# Patient Record
Sex: Male | Born: 1975 | Race: Black or African American | Hispanic: No | Marital: Single | Smoking: Current every day smoker
Health system: Southern US, Community
[De-identification: ages and names within clinical notes are randomized; demographics above are authoritative.]

## PROBLEM LIST (undated history)

## (undated) DIAGNOSIS — N200 Calculus of kidney: Secondary | ICD-10-CM

---

## 2005-03-11 ENCOUNTER — Emergency Department: Payer: Self-pay | Admitting: Emergency Medicine

## 2005-03-11 ENCOUNTER — Emergency Department: Payer: Self-pay | Admitting: Unknown Physician Specialty

## 2006-06-11 ENCOUNTER — Emergency Department: Payer: Self-pay | Admitting: Emergency Medicine

## 2008-06-03 ENCOUNTER — Emergency Department: Payer: Self-pay | Admitting: Emergency Medicine

## 2008-06-07 ENCOUNTER — Emergency Department: Payer: Self-pay | Admitting: Emergency Medicine

## 2008-08-21 ENCOUNTER — Emergency Department: Payer: Self-pay | Admitting: Emergency Medicine

## 2012-01-03 ENCOUNTER — Emergency Department: Payer: Self-pay | Admitting: Emergency Medicine

## 2015-07-15 DIAGNOSIS — Z5321 Procedure and treatment not carried out due to patient leaving prior to being seen by health care provider: Secondary | ICD-10-CM | POA: Insufficient documentation

## 2015-07-15 DIAGNOSIS — R0981 Nasal congestion: Secondary | ICD-10-CM | POA: Insufficient documentation

## 2015-07-15 NOTE — ED Notes (Signed)
Pt in with co body aches, congestion, and cough since yest. States has had chills at home unsure of fever.

## 2015-07-16 ENCOUNTER — Emergency Department
Admission: EM | Admit: 2015-07-16 | Discharge: 2015-07-16 | Disposition: A | Discharge status: Home / Self Care | Payer: Self-pay | Attending: Emergency Medicine | Admitting: Emergency Medicine

## 2016-12-31 ENCOUNTER — Encounter: Payer: Self-pay | Admitting: Emergency Medicine

## 2016-12-31 ENCOUNTER — Emergency Department: Payer: Self-pay

## 2016-12-31 ENCOUNTER — Emergency Department
Admission: EM | Admit: 2016-12-31 | Discharge: 2016-12-31 | Disposition: A | Discharge status: Home / Self Care | Payer: Self-pay | Attending: Emergency Medicine | Admitting: Emergency Medicine

## 2016-12-31 DIAGNOSIS — R319 Hematuria, unspecified: Secondary | ICD-10-CM | POA: Insufficient documentation

## 2016-12-31 DIAGNOSIS — N201 Calculus of ureter: Secondary | ICD-10-CM | POA: Insufficient documentation

## 2016-12-31 LAB — CBC
HCT: 41.1 % (ref 40.0–52.0)
Hemoglobin: 14 g/dL (ref 13.0–18.0)
MCH: 30.2 pg (ref 26.0–34.0)
MCHC: 34.1 g/dL (ref 32.0–36.0)
MCV: 88.8 fL (ref 80.0–100.0)
PLATELETS: 248 10*3/uL (ref 150–440)
RBC: 4.63 MIL/uL (ref 4.40–5.90)
RDW: 14.7 % — AB (ref 11.5–14.5)
WBC: 12 10*3/uL — ABNORMAL HIGH (ref 3.8–10.6)

## 2016-12-31 LAB — COMPREHENSIVE METABOLIC PANEL
ALK PHOS: 59 U/L (ref 38–126)
ALT: 55 U/L (ref 17–63)
AST: 34 U/L (ref 15–41)
Albumin: 4.4 g/dL (ref 3.5–5.0)
Anion gap: 9 (ref 5–15)
BUN: 14 mg/dL (ref 6–20)
CALCIUM: 9.3 mg/dL (ref 8.9–10.3)
CHLORIDE: 106 mmol/L (ref 101–111)
CO2: 26 mmol/L (ref 22–32)
CREATININE: 1.25 mg/dL — AB (ref 0.61–1.24)
GFR calc non Af Amer: >60 mL/min (ref >60)
GLUCOSE: 113 mg/dL — AB (ref 65–99)
Potassium: 3.9 mmol/L (ref 3.5–5.1)
SODIUM: 141 mmol/L (ref 135–145)
Total Bilirubin: 0.7 mg/dL (ref 0.3–1.2)
Total Protein: 7.7 g/dL (ref 6.5–8.1)

## 2016-12-31 LAB — URINALYSIS, COMPLETE (UACMP) WITH MICROSCOPIC
Bacteria, UA: NONE SEEN
Bilirubin Urine: NEGATIVE
GLUCOSE, UA: NEGATIVE mg/dL
KETONES UR: NEGATIVE mg/dL
Leukocytes, UA: NEGATIVE
Nitrite: NEGATIVE
PH: 5 (ref 5.0–8.0)
Protein, ur: 30 mg/dL — AB
SPECIFIC GRAVITY, URINE: 1.025 (ref 1.005–1.030)

## 2016-12-31 LAB — LIPASE, BLOOD: LIPASE: 50 U/L (ref 11–51)

## 2016-12-31 LAB — PHOSPHORUS: Phosphorus: 5.4 mg/dL — ABNORMAL HIGH (ref 2.5–4.6)

## 2016-12-31 MED ORDER — TAMSULOSIN HCL 0.4 MG PO CAPS: 0.4000 mg | ORAL_CAPSULE | Freq: Every day | ORAL | 0 refills | Status: DC

## 2016-12-31 MED ORDER — OXYCODONE-ACETAMINOPHEN 5-325 MG PO TABS
ORAL_TABLET | ORAL | Status: AC
  Administered 2016-12-31: 1 via ORAL
  Filled 2016-12-31: qty 1

## 2016-12-31 MED ORDER — OXYCODONE HCL 5 MG PO TABS: 5.0000 mg | ORAL_TABLET | Freq: Four times a day (QID) | ORAL | 0 refills | Status: DC | PRN

## 2016-12-31 MED ORDER — SODIUM CHLORIDE 0.9 % IV BOLUS (SEPSIS)
1000.0000 mL | Freq: Once | INTRAVENOUS | Status: AC
  Administered 2016-12-31: 1000 mL via INTRAVENOUS

## 2016-12-31 MED ORDER — ONDANSETRON 4 MG PO TBDP
4.0000 mg | ORAL_TABLET | Freq: Once | ORAL | Status: AC
  Administered 2016-12-31: 4 mg via ORAL
  Filled 2016-12-31: qty 1

## 2016-12-31 MED ORDER — OXYCODONE-ACETAMINOPHEN 5-325 MG PO TABS
1.0000 | ORAL_TABLET | Freq: Once | ORAL | Status: AC
  Administered 2016-12-31: 1 via ORAL

## 2016-12-31 MED ORDER — ONDANSETRON 4 MG PO TBDP: 4.0000 mg | ORAL_TABLET | Freq: Once | ORAL | Status: DC

## 2016-12-31 MED ORDER — NAPROXEN 500 MG PO TABS: 500.0000 mg | ORAL_TABLET | Freq: Two times a day (BID) | ORAL | 0 refills | Status: DC

## 2016-12-31 MED ORDER — ONDANSETRON 4 MG PO TBDP: 4.0000 mg | ORAL_TABLET | Freq: Three times a day (TID) | ORAL | 0 refills | Status: DC | PRN

## 2016-12-31 MED ORDER — KETOROLAC TROMETHAMINE 30 MG/ML IJ SOLN
15.0000 mg | INTRAMUSCULAR | Status: AC
  Administered 2016-12-31: 15 mg via INTRAVENOUS
  Filled 2016-12-31: qty 1

## 2016-12-31 NOTE — ED Notes (Signed)
Pt returned from CT, resting in bed, family at bedside 

## 2016-12-31 NOTE — ED Triage Notes (Signed)
Pt reports waking to sudden onset of right lower abdominal pain accompanied by N/V. Pt denies diarrhea. Pt has HX of kidney stones but sts pain is different.

## 2016-12-31 NOTE — ED Provider Notes (Signed)
[facility] Emergency Department Provider Note  ____________________________________________  Time seen: Approximately 8:57 AM  I have reviewed the triage vital signs and the nursing notes.   HISTORY  Chief Complaint Abdominal Pain and Flank Pain    HPI Curtis Barrett is a 41 y.o. male who complains of right flank pain radiating to the right lower quadrant since 3 AM today. Sudden onset, no aggravating or alleviating factors, severe, sharp. Waxing and waning. Associated vomiting. No fevers chills or sweats. No dysuria frequency urgency or hematuria. Has a history of kidney stones and this feels similar.     History reviewed. No pertinent past medical history. Kidney stones  There are no active problems to display for this patient.    History reviewed. No pertinent surgical history. Foot corn debridement  Prior to Admission medications   Medication Sig Start Date End Date Taking? Authorizing Provider  naproxen (NAPROSYN) 500 MG tablet Take 1 tablet (500 mg total) 2 (two) times daily with a meal by mouth. 12/31/16   Sharman CheekStafford, Domenica Weightman, MD  ondansetron (ZOFRAN ODT) 4 MG disintegrating tablet Take 1 tablet (4 mg total) every 8 (eight) hours as needed by mouth for nausea or vomiting. 12/31/16   Sharman CheekStafford, Deyjah Kindel, MD  oxyCODONE (ROXICODONE) 5 MG immediate release tablet Take 1 tablet (5 mg total) every 6 (six) hours as needed by mouth for breakthrough pain. 12/31/16   Sharman CheekStafford, Sheretha Shadd, MD  tamsulosin (FLOMAX) 0.4 MG CAPS capsule Take 1 capsule (0.4 mg total) daily by mouth. 12/31/16   Sharman CheekStafford, Farooq Petrovich, MD     Allergies Patient has no known allergies.   History reviewed. No pertinent family history.  Social History Social History   Tobacco Use  . Smoking status: Never Smoker  . Smokeless tobacco: Never Used  Substance Use Topics  . Alcohol use: Yes  . Drug use: Not on file    Review of Systems  Constitutional:   No fever or chills.  ENT:   No  sore throat. No rhinorrhea. Cardiovascular:   No chest pain or syncope. Respiratory:   No dyspnea or cough. Gastrointestinal:   Right flank pain as above with vomiting. No constipation or diarrhea.  Musculoskeletal:   Negative for focal pain or swelling All other systems reviewed and are negative except as documented above in ROS and HPI.  ____________________________________________   PHYSICAL EXAM:  VITAL SIGNS: ED Triage Vitals  Enc Vitals Group     BP 12/31/16 0409 (!) 140/99     Pulse Rate 12/31/16 0409 78     Resp 12/31/16 0409 17     Temp 12/31/16 0409 98 F (36.7 C)     Temp Source 12/31/16 0409 Oral     SpO2 12/31/16 0409 99 %     Weight 12/31/16 0405 230 lb (104.3 kg)     Height --      Head Circumference --      Peak Flow --      Pain Score 12/31/16 0652 6     Pain Loc --      Pain Edu? --      Excl. in GC? --     Vital signs reviewed, nursing assessments reviewed.   Constitutional:   Alert and oriented. Well appearing and in no distress. Eyes:   No scleral icterus.  EOMI. No nystagmus. No conjunctival pallor. PERRL. ENT   Head:   Normocephalic and atraumatic.   Nose:   No congestion/rhinnorhea.    Mouth/Throat:   MMM, no pharyngeal  erythema. No peritonsillar mass.    Neck:   No meningismus. Full ROM. Hematological/Lymphatic/Immunilogical:   No cervical lymphadenopathy. Cardiovascular:   RRR. Symmetric bilateral radial and DP pulses.  No murmurs.  Respiratory:   Normal respiratory effort without tachypnea/retractions. Breath sounds are clear and equal bilaterally. No wheezes/rales/rhonchi. Gastrointestinal:   Soft and nontender. Non distended. There is no CVA tenderness.  No rebound, rigidity, or guarding. Genitourinary:   deferred Musculoskeletal:   Normal range of motion in all extremities. No joint effusions.  No lower extremity tenderness.  No edema. Neurologic:   Normal speech and language.  Motor grossly intact. No gross focal neurologic  deficits are appreciated.  Skin:    Skin is warm, dry and intact. No rash noted.  No petechiae, purpura, or bullae.  ____________________________________________    LABS (pertinent positives/negatives) (all labs ordered are listed, but only abnormal results are displayed) Labs Reviewed  COMPREHENSIVE METABOLIC PANEL - Abnormal; Notable for the following components:      Result Value   Glucose, Bld 113 (*)    Creatinine, Ser 1.25 (*)    All other components within normal limits  CBC - Abnormal; Notable for the following components:   WBC 12.0 (*)    RDW 14.7 (*)    All other components within normal limits  URINALYSIS, COMPLETE (UACMP) WITH MICROSCOPIC - Abnormal; Notable for the following components:   Color, Urine YELLOW (*)    APPearance CLEAR (*)    Hgb urine dipstick LARGE (*)    Protein, ur 30 (*)    Squamous Epithelial / LPF 0-5 (*)    All other components within normal limits  PHOSPHORUS - Abnormal; Notable for the following components:   Phosphorus 5.4 (*)    All other components within normal limits  LIPASE, BLOOD   ____________________________________________   EKG    ____________________________________________    RADIOLOGY  Ct Renal Stone Study  Result Date: 12/31/2016 CLINICAL DATA:  Right lower quadrant abdominal pain old the patient today, radiating to the back. Vomiting. EXAM: CT ABDOMEN AND PELVIS WITHOUT CONTRAST TECHNIQUE: Multidetector CT imaging of the abdomen and pelvis was performed following the standard protocol without IV contrast. COMPARISON:  CT scan 06/03/2008 FINDINGS: Lower chest: Unremarkable Hepatobiliary: Mild diffuse hepatic steatosis. Gallbladder unremarkable. Pancreas: Unremarkable Spleen: Unremarkable Adrenals/Urinary Tract: Abnormal right perirenal stranding with mild right hydronephrosis and mild relative hypodensity of the right kidney compared to the left attributable to obstruction from a 7 mm in long axis renal calculus in the  right proximal ureter just below the right UPJ. The ureter distal to this point is of normal caliber and no other definite urinary tract calculi are identified. Left mid kidney 1.3 cm fluid density lesion compatible with cyst. Adrenal glands normal. Stomach/Bowel: Unremarkable Vascular/Lymphatic: Left external iliac lymph node borderline enlarged at 1.0 cm on image 78/2, formerly 0.7 cm. Reproductive: Faint linear calcification along the right prostate apex but otherwise unremarkable. Other: No supplemental non-categorized findings. Musculoskeletal: 1.9 by 1.5 cm lucent lesion in the cortex of the quadrate tubercle of the left femur with faint sclerotic margin, previously measured 1.4 by 1.1 cm on 06/03/2008. This lesion appears to be primarily cortical. Careful review demonstrates no other suspicious lucent lesions. Right paracentral disc protrusion at L4-5 extending cephalad probably causing some degree of right subarticular lateral recess stenosis. IMPRESSION: 1. 7 mm in long axis right proximal ureteral stone just past the UPJ causing mild right hydronephrosis, asymmetric perirenal stranding, and mild right hypodensity compatible with an obstructive  calculus. No other urinary tract calculi are identified. 2. A lucent cortical lesion of the quadrate tubercle of the left femur has enlarged mildly over the last 8 years. The very slow level of enlargement would presumably favor a benign etiology over into the such as infection, metastatic disease, or myeloma. The more likely benign possibilities include brown tumor of hyperparathyroidism, osteoblastoma, bone cyst or aneurysmal bone cyst, or residua from a fibrous cortical defect. The structure is extra-articular and accordingly geode is not suspected. 3. Mild diffuse hepatic steatosis. 4. Disc protrusion at L4-5 potentially causing right subarticular lateral recess stenosis. 5. Borderline prominent left external iliac lymph node at 1.0 cm diameter, significance  uncertain. Electronically Signed   By: Gaylyn Rong M.D.   On: 12/31/2016 07:40    ____________________________________________   PROCEDURES Procedures  ____________________________________________   DIFFERENTIAL DIAGNOSIS  UTI, pyelonephritis, kidney stone, appendicitis  CLINICAL IMPRESSION / ASSESSMENT AND PLAN / ED COURSE  Pertinent labs & imaging results that were available during my care of the patient were reviewed by me and considered in my medical decision making (see chart for details).   Check labs, CT scan. Percocet given.  Clinical Course as of Jan 01 856  Thu Dec 31, 2016  1610 Obstructing stone, slight creatinine elevation. No uti. Will d/w urology  [PS]  [phone] D/w Sherryl Barters. Since pain decently controlled, rec outpatient follow up with pain control.   [PS]    Clinical Course User Index [PS] Sharman Cheek, MD    ----------------------------------------- 9:00 AM on 12/31/2016 -----------------------------------------  Pain controlled, tolerating oral intake. We'll discharge home. Counseled on opioid use. Naproxen and Tylenol, Flomax. Follow up with urology. Return precautions discussed   ____________________________________________   FINAL CLINICAL IMPRESSION(S) / ED DIAGNOSES    Final diagnoses:  Ureterolithiasis  Hematuria, unspecified type        Portions of this note were generated with dragon dictation software. Dictation errors may occur despite best attempts at proofreading.    Sharman Cheek, MD 12/31/16 [phone]

## 2016-12-31 NOTE — Discharge Instructions (Signed)
Take aleve (naproxen) 500mg  twice daily.  Take tylenol (acetaminophen) 1000 mg three times a day. Take oxycodone as needed for severe pain. Do not take this more often than necessary. Follow up with urology in 2 days for continued monitoring of your symptoms.

## 2016-12-31 NOTE — ED Notes (Signed)
Pt states that he was sleep and woke up suddenly with pain on lower right quad and radiating to the back also vomiting. Family at the bedside.

## 2016-12-31 NOTE — ED Notes (Signed)
EDP at bedside for update 

## 2016-12-31 NOTE — ED Notes (Signed)
Patient transported to CT 

## 2017-05-03 ENCOUNTER — Other Ambulatory Visit: Payer: Self-pay

## 2017-05-03 ENCOUNTER — Emergency Department
Admission: EM | Admit: 2017-05-03 | Discharge: 2017-05-03 | Disposition: A | Discharge status: Home / Self Care | Payer: Self-pay | Attending: Emergency Medicine | Admitting: Emergency Medicine

## 2017-05-03 ENCOUNTER — Encounter: Payer: Self-pay | Admitting: Emergency Medicine

## 2017-05-03 DIAGNOSIS — Z5321 Procedure and treatment not carried out due to patient leaving prior to being seen by health care provider: Secondary | ICD-10-CM | POA: Insufficient documentation

## 2017-05-03 DIAGNOSIS — R109 Unspecified abdominal pain: Secondary | ICD-10-CM | POA: Insufficient documentation

## 2017-05-03 HISTORY — DX: Calculus of kidney: N20.0

## 2017-05-03 LAB — COMPREHENSIVE METABOLIC PANEL
ALBUMIN: 4.8 g/dL (ref 3.5–5.0)
ALT: 38 U/L (ref 17–63)
AST: 26 U/L (ref 15–41)
Alkaline Phosphatase: 49 U/L (ref 38–126)
Anion gap: 9 (ref 5–15)
BILIRUBIN TOTAL: 0.5 mg/dL (ref 0.3–1.2)
BUN: 7 mg/dL (ref 6–20)
CHLORIDE: 105 mmol/L (ref 101–111)
CO2: 24 mmol/L (ref 22–32)
Calcium: 9.3 mg/dL (ref 8.9–10.3)
Creatinine, Ser: 1.1 mg/dL (ref 0.61–1.24)
GFR calc Af Amer: >60 mL/min (ref >60)
GFR calc non Af Amer: >60 mL/min (ref >60)
GLUCOSE: 100 mg/dL — AB (ref 65–99)
POTASSIUM: 4.7 mmol/L (ref 3.5–5.1)
SODIUM: 138 mmol/L (ref 135–145)
Total Protein: 8 g/dL (ref 6.5–8.1)

## 2017-05-03 LAB — URINALYSIS, COMPLETE (UACMP) WITH MICROSCOPIC
BILIRUBIN URINE: NEGATIVE
Bacteria, UA: NONE SEEN
GLUCOSE, UA: NEGATIVE mg/dL
KETONES UR: NEGATIVE mg/dL
LEUKOCYTES UA: NEGATIVE
Nitrite: NEGATIVE
PROTEIN: 30 mg/dL — AB
Specific Gravity, Urine: 1.02 (ref 1.005–1.030)
pH: 5 (ref 5.0–8.0)

## 2017-05-03 LAB — CBC WITH DIFFERENTIAL/PLATELET
BASOS ABS: 0.1 10*3/uL (ref 0–0.1)
BASOS PCT: 1 %
EOS ABS: 0.2 10*3/uL (ref 0–0.7)
Eosinophils Relative: 3 %
HEMATOCRIT: 43.2 % (ref 40.0–52.0)
Hemoglobin: 14.1 g/dL (ref 13.0–18.0)
Lymphocytes Relative: 30 %
Lymphs Abs: 2.7 10*3/uL (ref 1.0–3.6)
MCH: 29.4 pg (ref 26.0–34.0)
MCHC: 32.5 g/dL (ref 32.0–36.0)
MCV: 90.3 fL (ref 80.0–100.0)
Monocytes Absolute: 1.4 10*3/uL — ABNORMAL HIGH (ref 0.2–1.0)
Monocytes Relative: 16 %
NEUTROS ABS: 4.6 10*3/uL (ref 1.4–6.5)
Neutrophils Relative %: 50 %
PLATELETS: 238 10*3/uL (ref 150–440)
RBC: 4.79 MIL/uL (ref 4.40–5.90)
RDW: 14.7 % — AB (ref 11.5–14.5)
WBC: 9.1 10*3/uL (ref 3.8–10.6)

## 2017-05-03 NOTE — ED Notes (Signed)
Called in waiting room, no answer

## 2017-05-03 NOTE — ED Notes (Signed)
Called in waiting room.  No answer. 

## 2017-05-03 NOTE — ED Triage Notes (Signed)
R flank pain about 3pm. States lasted approx 2 1/2 hours. Now has subsided.

## 2017-05-03 NOTE — ED Notes (Signed)
Pt called several times from lobby with no answer 

## 2018-11-05 IMAGING — CT CT RENAL STONE PROTOCOL
2 of 4 series · 15 of 46 positions shown, 17 images · non-contrast
Comparison: CT scan 06/03/2008

CLINICAL DATA: Right lower quadrant abdominal pain old the patient
today, radiating to the back. Vomiting.

EXAM:
CT ABDOMEN AND PELVIS WITHOUT CONTRAST
TECHNIQUE: Multidetector CT imaging of the abdomen and pelvis was performed
following the standard protocol without IV contrast.

[Series 2: stone full standard · axial · 0.90mm/px · z∈[-536,-81]mm · 12 of 101 slices shown, 14 images]
[im 5/101  soft-tissue]
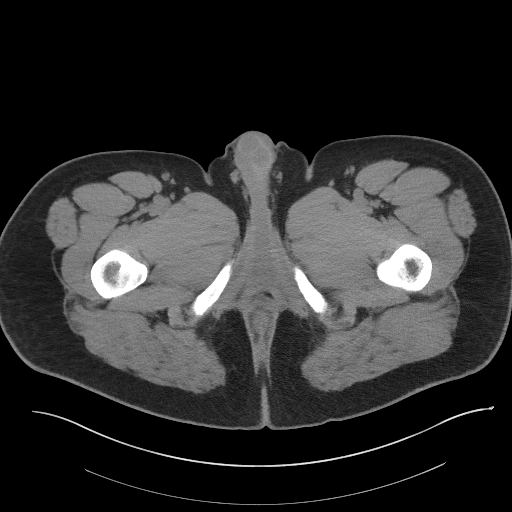
[im 5/101  bone]
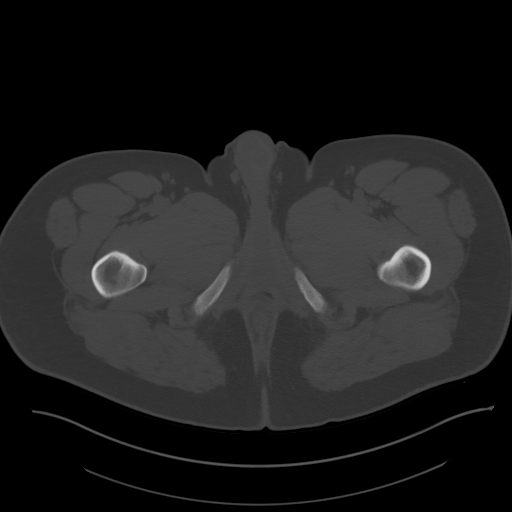
[im 14/101  soft-tissue]
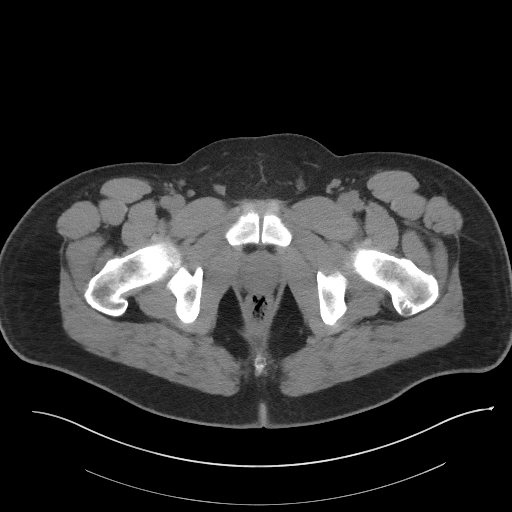
[im 22/101  soft-tissue]
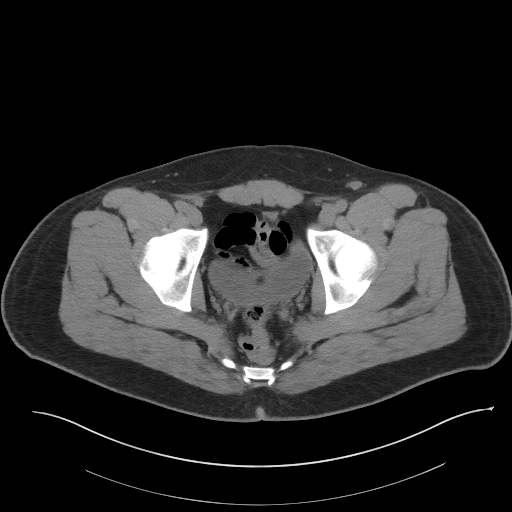
[im 31/101  soft-tissue]
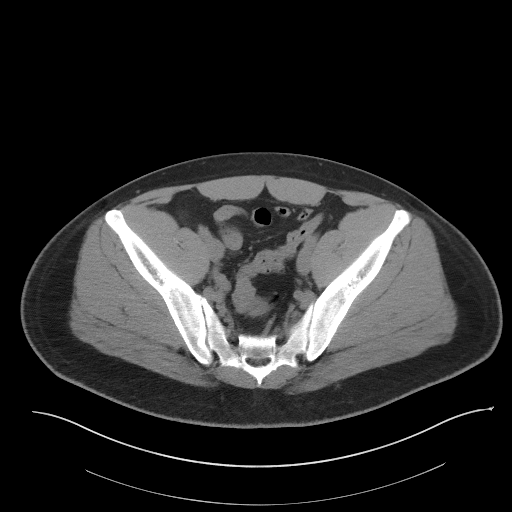
[im 40/101  soft-tissue]
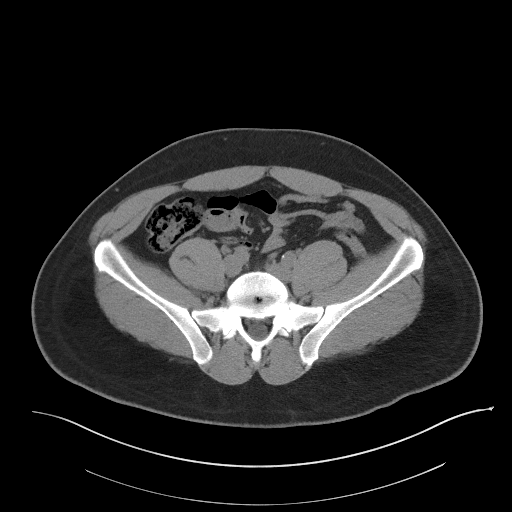
[im 48/101  soft-tissue]
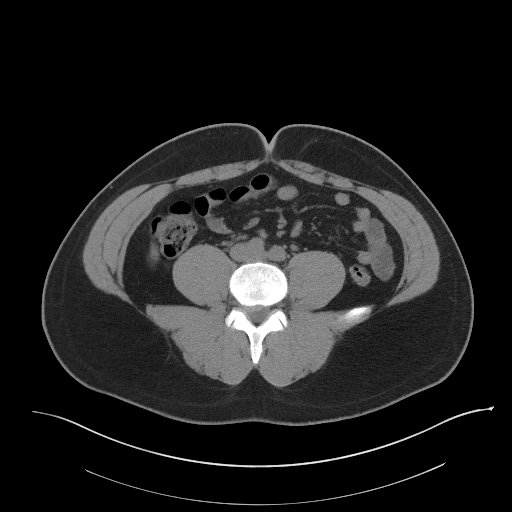
[im 53/101  soft-tissue]
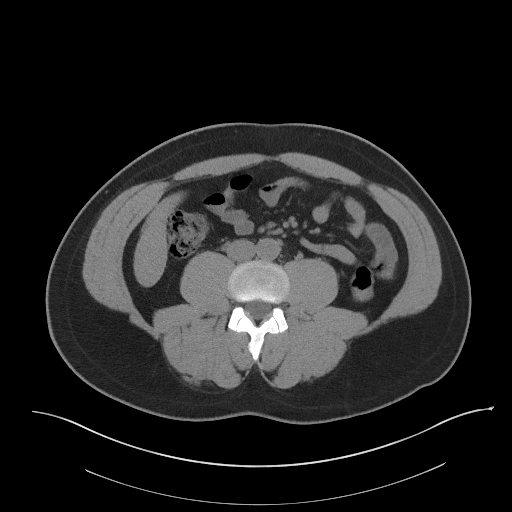
[im 61/101  soft-tissue]
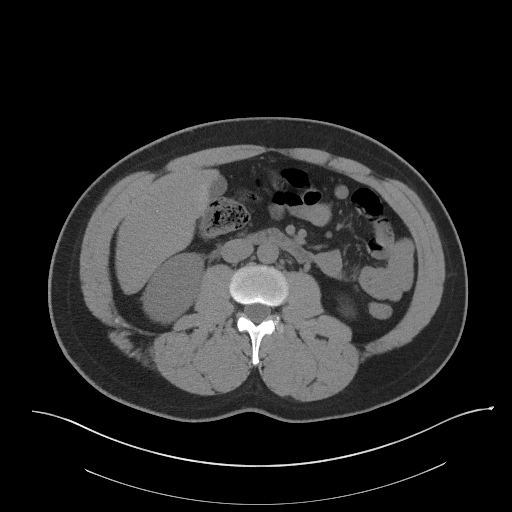
[im 70/101  soft-tissue]
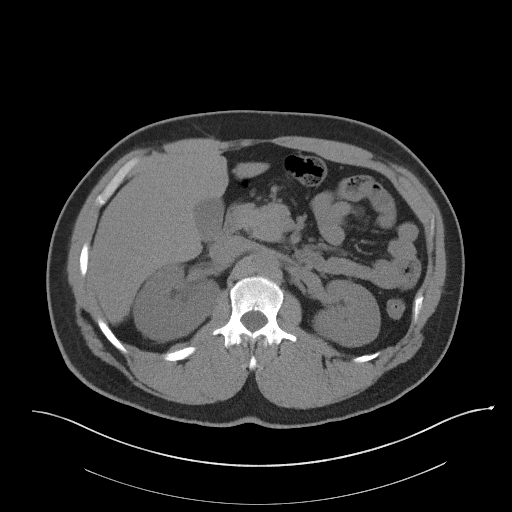
[im 70/101  bone]
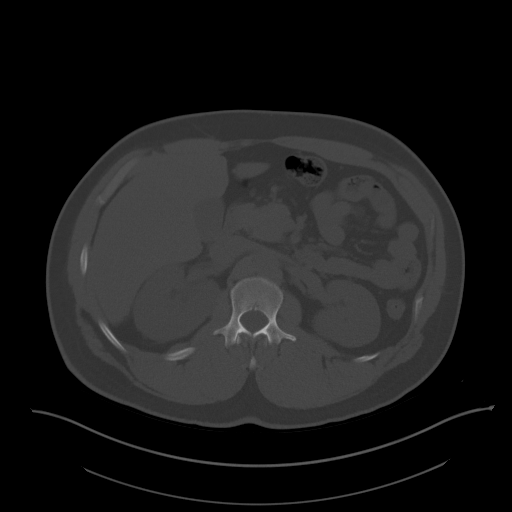
[im 79/101  soft-tissue]
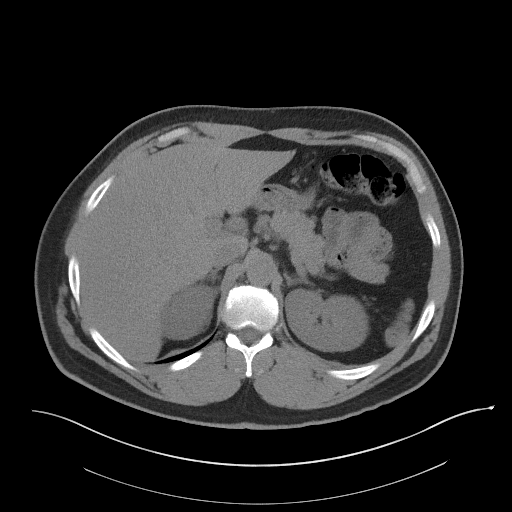
[im 87/101  soft-tissue]
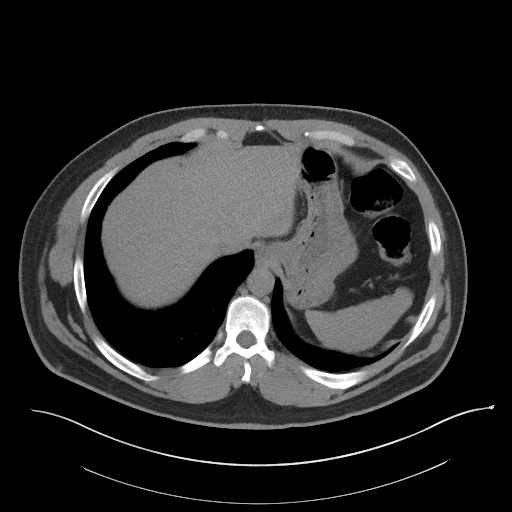
[im 96/101  soft-tissue]
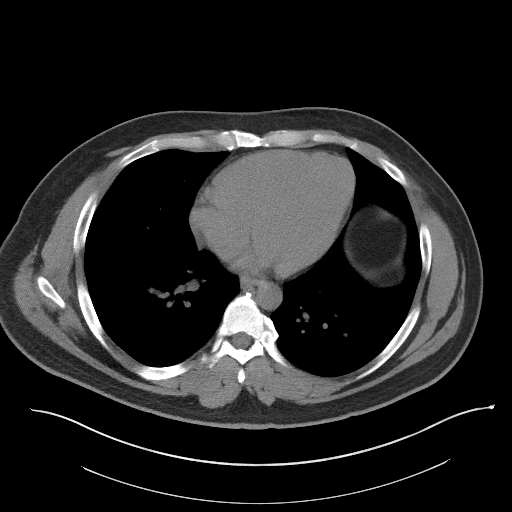

[Series 5: coronal · coronal · 0.85mm/px · 3 of 141 slices shown]
[im 47/141  soft-tissue]
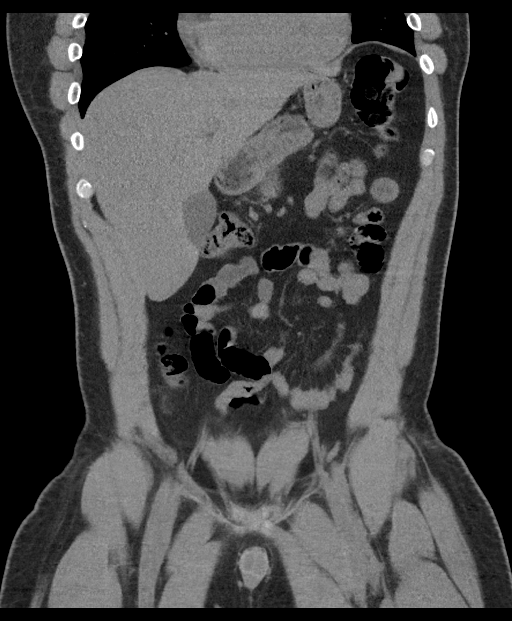
[im 63/141  soft-tissue]
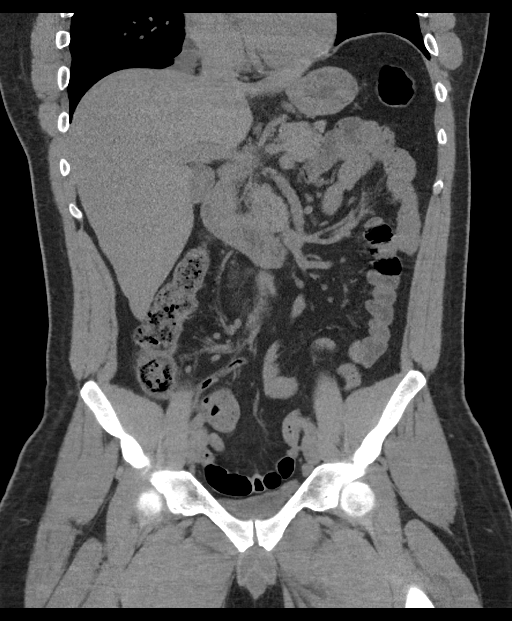
[im 78/141  soft-tissue]
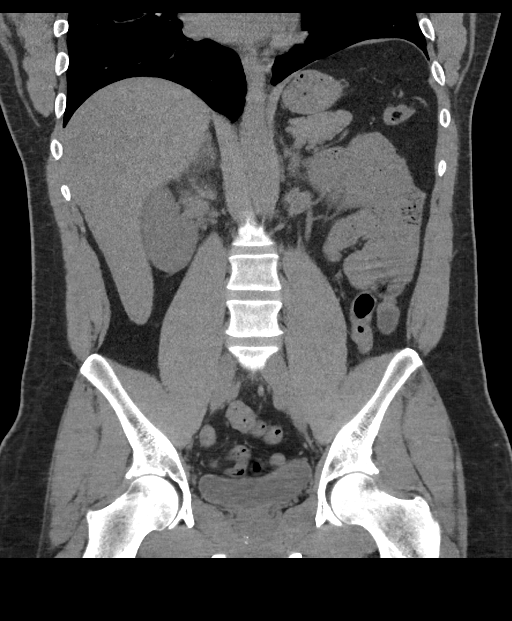

[15 of 46 positions shown; findings below may reference images not displayed]

FINDINGS: Lower chest: Unremarkable

Hepatobiliary: Mild diffuse hepatic steatosis. Gallbladder
unremarkable.

Pancreas: Unremarkable

Spleen: Unremarkable

Adrenals/Urinary Tract: Abnormal right perirenal stranding with mild
right hydronephrosis and mild relative hypodensity of the right
kidney compared to the left attributable to obstruction from a 7 mm
in long axis renal calculus in the right proximal ureter just below
the right UPJ. The ureter distal to this point is of normal caliber
and no other definite urinary tract calculi are identified.

Left mid kidney 1.3 cm fluid density lesion compatible with cyst.

Adrenal glands normal.

Stomach/Bowel: Unremarkable

Vascular/Lymphatic: Left external iliac lymph node borderline
enlarged at 1.0 cm on image 78/2, formerly 0.7 cm.

Reproductive: Faint linear calcification along the right prostate
apex but otherwise unremarkable.

Other: No supplemental non-categorized findings.

Musculoskeletal: 1.9 by 1.5 cm lucent lesion in the cortex of the
quadrate tubercle of the left femur with faint sclerotic margin,
previously measured 1.4 by 1.1 cm on 06/03/2008. This lesion appears
to be primarily cortical. Careful review demonstrates no other
suspicious lucent lesions.

Right paracentral disc protrusion at L4-5 extending cephalad
probably causing some degree of right subarticular lateral recess
stenosis.
IMPRESSION: 1. 7 mm in long axis right proximal ureteral stone just past the UPJ
causing mild right hydronephrosis, asymmetric perirenal stranding,
and mild right hypodensity compatible with an obstructive calculus.
No other urinary tract calculi are identified.
2. A lucent cortical lesion of the quadrate tubercle of the left
femur has enlarged mildly over the last 8 years. The very slow level
of enlargement would presumably favor a benign etiology over into
the such as infection, metastatic disease, or myeloma. The more
likely benign possibilities include brown tumor of
hyperparathyroidism, osteoblastoma, bone cyst or aneurysmal bone
cyst, or residua from a fibrous cortical defect. The structure is
extra-articular and accordingly geode is not suspected.
3. Mild diffuse hepatic steatosis.
4. Disc protrusion at L4-5 potentially causing right subarticular
lateral recess stenosis.
5. Borderline prominent left external iliac lymph node at 1.0 cm
diameter, significance uncertain.

## 2020-10-17 ENCOUNTER — Other Ambulatory Visit: Payer: Self-pay

## 2024-02-15 ENCOUNTER — Emergency Department

## 2024-02-15 ENCOUNTER — Emergency Department
Admission: EM | Admit: 2024-02-15 | Discharge: 2024-02-15 | Discharge status: Against Medical Advice | Attending: Emergency Medicine | Admitting: Emergency Medicine

## 2024-02-15 ENCOUNTER — Other Ambulatory Visit: Payer: Self-pay

## 2024-02-15 ENCOUNTER — Encounter: Payer: Self-pay | Admitting: Emergency Medicine

## 2024-02-15 DIAGNOSIS — M791 Myalgia, unspecified site: Secondary | ICD-10-CM | POA: Diagnosis not present

## 2024-02-15 DIAGNOSIS — M7918 Myalgia, other site: Secondary | ICD-10-CM

## 2024-02-15 DIAGNOSIS — Y92410 Unspecified street and highway as the place of occurrence of the external cause: Secondary | ICD-10-CM | POA: Diagnosis not present

## 2024-02-15 DIAGNOSIS — S161XXA Strain of muscle, fascia and tendon at neck level, initial encounter: Secondary | ICD-10-CM | POA: Insufficient documentation

## 2024-02-15 DIAGNOSIS — S199XXA Unspecified injury of neck, initial encounter: Secondary | ICD-10-CM | POA: Diagnosis present

## 2024-02-15 MED ORDER — KETOROLAC TROMETHAMINE 15 MG/ML IJ SOLN: 15.0000 mg | Freq: Once | INTRAMUSCULAR | Status: DC

## 2024-02-15 MED ORDER — KETOROLAC TROMETHAMINE 15 MG/ML IJ SOLN
15.0000 mg | Freq: Once | INTRAMUSCULAR | Status: AC
  Administered 2024-02-15: 15 mg via INTRAMUSCULAR
  Filled 2024-02-15: qty 1

## 2024-02-15 NOTE — Discharge Instructions (Signed)
 Follow-up with your primary care provider if any continued problems.  Begin taking ibuprofen 3 tablets with food 4 times a day as needed for muscle pain or soreness.  You may use ice or heat to your muscles as needed for discomfort.
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
